# Patient Record
Sex: Male | Born: 1970 | Race: White | Hispanic: No | Marital: Married | Smoking: Current every day smoker
Health system: Southern US, Community
[De-identification: ages and names within clinical notes are randomized; demographics above are authoritative.]

## PROBLEM LIST (undated history)

## (undated) HISTORY — PX: GASTRIC BYPASS: SHX52

## (undated) HISTORY — PX: TONSILLECTOMY: SUR1361

---

## 2010-05-21 ENCOUNTER — Inpatient Hospital Stay: Payer: Self-pay | Admitting: Specialist

## 2010-05-26 ENCOUNTER — Emergency Department: Payer: Self-pay | Admitting: Emergency Medicine

## 2013-02-12 ENCOUNTER — Ambulatory Visit: Payer: Self-pay

## 2018-05-14 ENCOUNTER — Ambulatory Visit: Admission: RE | Admit: 2018-05-14 | Payer: 59 | Source: Ambulatory Visit | Admitting: Gastroenterology

## 2018-05-14 ENCOUNTER — Encounter: Admission: RE | Payer: Self-pay | Source: Ambulatory Visit

## 2018-05-14 SURGERY — EGD (ESOPHAGOGASTRODUODENOSCOPY)
Anesthesia: General

## 2019-03-11 ENCOUNTER — Other Ambulatory Visit: Payer: Self-pay | Admitting: Internal Medicine

## 2019-03-11 DIAGNOSIS — R918 Other nonspecific abnormal finding of lung field: Secondary | ICD-10-CM

## 2019-03-21 ENCOUNTER — Other Ambulatory Visit: Payer: Self-pay

## 2019-03-21 ENCOUNTER — Ambulatory Visit
Admission: RE | Admit: 2019-03-21 | Discharge: 2019-03-21 | Disposition: A | Discharge status: Home / Self Care | Payer: 59 | Source: Ambulatory Visit | Attending: Internal Medicine | Admitting: Internal Medicine

## 2019-03-21 DIAGNOSIS — R918 Other nonspecific abnormal finding of lung field: Secondary | ICD-10-CM | POA: Insufficient documentation

## 2020-01-30 ENCOUNTER — Emergency Department
Admission: EM | Admit: 2020-01-30 | Discharge: 2020-01-30 | Disposition: A | Discharge status: Home / Self Care | Payer: 59 | Attending: Emergency Medicine | Admitting: Emergency Medicine

## 2020-01-30 ENCOUNTER — Other Ambulatory Visit: Payer: Self-pay

## 2020-01-30 DIAGNOSIS — M7989 Other specified soft tissue disorders: Secondary | ICD-10-CM | POA: Diagnosis not present

## 2020-01-30 DIAGNOSIS — Z5321 Procedure and treatment not carried out due to patient leaving prior to being seen by health care provider: Secondary | ICD-10-CM | POA: Diagnosis not present

## 2020-01-30 DIAGNOSIS — M25471 Effusion, right ankle: Secondary | ICD-10-CM | POA: Diagnosis not present

## 2020-01-30 DIAGNOSIS — R110 Nausea: Secondary | ICD-10-CM | POA: Diagnosis not present

## 2020-01-30 DIAGNOSIS — R21 Rash and other nonspecific skin eruption: Secondary | ICD-10-CM | POA: Insufficient documentation

## 2020-01-30 DIAGNOSIS — R197 Diarrhea, unspecified: Secondary | ICD-10-CM | POA: Diagnosis not present

## 2020-01-30 DIAGNOSIS — A770 Spotted fever due to Rickettsia rickettsii: Secondary | ICD-10-CM | POA: Diagnosis not present

## 2020-01-30 LAB — COMPREHENSIVE METABOLIC PANEL
ALT: 58 U/L — ABNORMAL HIGH (ref 0–44)
AST: 57 U/L — ABNORMAL HIGH (ref 15–41)
Albumin: 3.5 g/dL (ref 3.5–5.0)
Alkaline Phosphatase: 71 U/L (ref 38–126)
Anion gap: 13 (ref 5–15)
BUN: 12 mg/dL (ref 6–20)
CO2: 21 mmol/L — ABNORMAL LOW (ref 22–32)
Calcium: 8.4 mg/dL — ABNORMAL LOW (ref 8.9–10.3)
Chloride: 99 mmol/L (ref 98–111)
Creatinine, Ser: 0.78 mg/dL (ref 0.61–1.24)
GFR calc Af Amer: >60 mL/min (ref >60)
GFR calc non Af Amer: >60 mL/min (ref >60)
Glucose, Bld: 120 mg/dL — ABNORMAL HIGH (ref 70–99)
Potassium: 3 mmol/L — ABNORMAL LOW (ref 3.5–5.1)
Sodium: 133 mmol/L — ABNORMAL LOW (ref 135–145)
Total Bilirubin: 0.9 mg/dL (ref 0.3–1.2)
Total Protein: 7.3 g/dL (ref 6.5–8.1)

## 2020-01-30 LAB — CBC WITH DIFFERENTIAL/PLATELET
Abs Immature Granulocytes: 0.6 10*3/uL — ABNORMAL HIGH (ref 0.00–0.07)
Basophils Absolute: 0 10*3/uL (ref 0.0–0.1)
Basophils Relative: 0 %
Eosinophils Absolute: 0.1 10*3/uL (ref 0.0–0.5)
Eosinophils Relative: 1 %
HCT: 38.5 % — ABNORMAL LOW (ref 39.0–52.0)
Hemoglobin: 13.5 g/dL (ref 13.0–17.0)
Immature Granulocytes: 4 %
Lymphocytes Relative: 15 %
Lymphs Abs: 2.1 10*3/uL (ref 0.7–4.0)
MCH: 32.1 pg (ref 26.0–34.0)
MCHC: 35.1 g/dL (ref 30.0–36.0)
MCV: 91.7 fL (ref 80.0–100.0)
Monocytes Absolute: 1.5 10*3/uL — ABNORMAL HIGH (ref 0.1–1.0)
Monocytes Relative: 11 %
Neutro Abs: 9.5 10*3/uL — ABNORMAL HIGH (ref 1.7–7.7)
Neutrophils Relative %: 69 %
Platelets: 433 10*3/uL — ABNORMAL HIGH (ref 150–400)
RBC: 4.2 MIL/uL — ABNORMAL LOW (ref 4.22–5.81)
RDW: 12.6 % (ref 11.5–15.5)
WBC: 13.8 10*3/uL — ABNORMAL HIGH (ref 4.0–10.5)
nRBC: 0 % (ref 0.0–0.2)

## 2020-01-30 NOTE — ED Triage Notes (Signed)
Pt reports recent visit to ER for possible [place] fever. Has been taking antibiotic.  Pt reports swelling to joints (right elbow, left knee, and right ankle) with difficulty swelling. No redness noted.  Reports has been over 24 hours without fever.  +diarrhea, intermittent nausea Light pink spotted rash noted to right ankle, pt reports extend up legs  Pt in NAD, RR even and unlabored   This RN discussed pt with Dr Larinda Buttery

## 2020-03-30 ENCOUNTER — Other Ambulatory Visit (HOSPITAL_COMMUNITY): Payer: Self-pay | Admitting: Internal Medicine

## 2020-03-30 ENCOUNTER — Other Ambulatory Visit: Payer: Self-pay | Admitting: Internal Medicine

## 2020-03-30 DIAGNOSIS — R918 Other nonspecific abnormal finding of lung field: Secondary | ICD-10-CM

## 2020-04-09 ENCOUNTER — Other Ambulatory Visit: Payer: Self-pay | Admitting: Student

## 2020-04-09 DIAGNOSIS — M47816 Spondylosis without myelopathy or radiculopathy, lumbar region: Secondary | ICD-10-CM

## 2020-04-09 DIAGNOSIS — M545 Low back pain, unspecified: Secondary | ICD-10-CM

## 2020-04-09 DIAGNOSIS — M461 Sacroiliitis, not elsewhere classified: Secondary | ICD-10-CM

## 2020-04-14 ENCOUNTER — Ambulatory Visit
Admission: RE | Admit: 2020-04-14 | Discharge: 2020-04-14 | Disposition: A | Discharge status: Home / Self Care | Payer: 59 | Source: Ambulatory Visit | Attending: Internal Medicine | Admitting: Internal Medicine

## 2020-04-14 ENCOUNTER — Other Ambulatory Visit: Payer: Self-pay

## 2020-04-14 DIAGNOSIS — R918 Other nonspecific abnormal finding of lung field: Secondary | ICD-10-CM | POA: Insufficient documentation

## 2020-04-29 ENCOUNTER — Ambulatory Visit: Admission: RE | Admit: 2020-04-29 | Payer: 59 | Source: Ambulatory Visit

## 2020-05-10 ENCOUNTER — Other Ambulatory Visit: Payer: Self-pay

## 2020-05-10 ENCOUNTER — Ambulatory Visit
Admission: RE | Admit: 2020-05-10 | Discharge: 2020-05-10 | Disposition: A | Discharge status: Home / Self Care | Payer: 59 | Source: Ambulatory Visit | Attending: Student | Admitting: Student

## 2020-05-10 DIAGNOSIS — M47816 Spondylosis without myelopathy or radiculopathy, lumbar region: Secondary | ICD-10-CM | POA: Diagnosis present

## 2020-05-10 DIAGNOSIS — M461 Sacroiliitis, not elsewhere classified: Secondary | ICD-10-CM | POA: Diagnosis present

## 2020-05-10 DIAGNOSIS — M545 Low back pain, unspecified: Secondary | ICD-10-CM | POA: Diagnosis present

## 2020-05-20 ENCOUNTER — Emergency Department
Admission: EM | Admit: 2020-05-20 | Discharge: 2020-05-21 | Disposition: A | Discharge status: Home / Self Care | Payer: 59 | Attending: Emergency Medicine | Admitting: Emergency Medicine

## 2020-05-20 ENCOUNTER — Other Ambulatory Visit: Payer: Self-pay

## 2020-05-20 ENCOUNTER — Emergency Department: Payer: 59

## 2020-05-20 DIAGNOSIS — T148XXA Other injury of unspecified body region, initial encounter: Secondary | ICD-10-CM

## 2020-05-20 DIAGNOSIS — M79662 Pain in left lower leg: Secondary | ICD-10-CM | POA: Insufficient documentation

## 2020-05-20 DIAGNOSIS — M7981 Nontraumatic hematoma of soft tissue: Secondary | ICD-10-CM | POA: Insufficient documentation

## 2020-05-20 DIAGNOSIS — M7989 Other specified soft tissue disorders: Secondary | ICD-10-CM | POA: Insufficient documentation

## 2020-05-20 DIAGNOSIS — F17210 Nicotine dependence, cigarettes, uncomplicated: Secondary | ICD-10-CM | POA: Insufficient documentation

## 2020-05-20 LAB — BASIC METABOLIC PANEL
Anion gap: 11 (ref 5–15)
BUN: 10 mg/dL (ref 6–20)
CO2: 21 mmol/L — ABNORMAL LOW (ref 22–32)
Calcium: 9.1 mg/dL (ref 8.9–10.3)
Chloride: 106 mmol/L (ref 98–111)
Creatinine, Ser: 0.59 mg/dL — ABNORMAL LOW (ref 0.61–1.24)
GFR, Estimated: >60 mL/min (ref >60)
Glucose, Bld: 102 mg/dL — ABNORMAL HIGH (ref 70–99)
Potassium: 3.5 mmol/L (ref 3.5–5.1)
Sodium: 138 mmol/L (ref 135–145)

## 2020-05-20 LAB — CBC
HCT: 40.8 % (ref 39.0–52.0)
Hemoglobin: 14.2 g/dL (ref 13.0–17.0)
MCH: 32.4 pg (ref 26.0–34.0)
MCHC: 34.8 g/dL (ref 30.0–36.0)
MCV: 93.2 fL (ref 80.0–100.0)
Platelets: 287 10*3/uL (ref 150–400)
RBC: 4.38 MIL/uL (ref 4.22–5.81)
RDW: 13.2 % (ref 11.5–15.5)
WBC: 16 10*3/uL — ABNORMAL HIGH (ref 4.0–10.5)
nRBC: 0 % (ref 0.0–0.2)

## 2020-05-20 MED ORDER — OXYCODONE-ACETAMINOPHEN 5-325 MG PO TABS
1.0000 | ORAL_TABLET | Freq: Once | ORAL | Status: AC
  Administered 2020-05-20: 1 via ORAL
  Filled 2020-05-20: qty 1

## 2020-05-20 MED ORDER — OXYCODONE-ACETAMINOPHEN 5-325 MG PO TABS: 1.0000 | ORAL_TABLET | ORAL | 0 refills | Status: AC | PRN

## 2020-05-20 NOTE — ED Triage Notes (Signed)
Pt arrived via POV with reports of L leg calf swelling since having MRI about 10 days ago. Pt reports calf is painful, red and warm to the touch.    Swelling noted from foot to L knee.  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

## 2020-05-20 NOTE — ED Notes (Signed)
Pt updated on wait time, pt has left leg propped up on chair, no distress noted at this time.

## 2020-05-20 NOTE — ED Notes (Signed)
Patient resting in stretcher. Pt c/o 5/10 pain when resting, "pain only goes to 10/10 when the leg is touched".  NAD noted, VSS. Pt has visitor bedside. Denies further needs at this time.

## 2020-05-20 NOTE — ED Provider Notes (Signed)
[facility] Emergency Department Provider Note   ____________________________________________   Event Date/Time   First MD Initiated Contact with Patient 05/20/20 2312     (approximate)  I have reviewed the triage vital signs and the nursing notes.   HISTORY  Chief Complaint Leg Swelling    HPI James Calderon is a 49 y.o. male who presents to the ED from home with a chief complaint of left calf swelling since having lumbar spine MRI approximately 10 days ago.  Does not remember fall/injury/trauma.  Reports calf is painful, tender, red and warm to the touch.  Denies associated chest pain or shortness of breath.  Denies recent fever, cough, abdominal pain, nausea, vomiting or dizziness.  Denies recent travel or hormone use.     Past medical history Sacroiliitis  There are no problems to display for this patient.   Past Surgical History:  Procedure Laterality Date  . GASTRIC BYPASS    . TONSILLECTOMY      Prior to Admission medications   Medication Sig Start Date End Date Taking? Authorizing Provider  oxyCODONE-acetaminophen (PERCOCET/ROXICET) 5-325 MG tablet Take 1 tablet by mouth every 4 (four) hours as needed for severe pain. 05/20/20   Irean Hong, MD    Allergies Cephalosporins, Nsaids, Pravastatin, Pravastatin sodium, and Sulfa antibiotics  No family history on file.  Social History Social History   Tobacco Use  . Smoking status: Current Every Day Smoker    Packs/day: 0.50    Types: Cigarettes  . Smokeless tobacco: Never Used  Substance Use Topics  . Alcohol use: Yes  . Drug use: Not Currently    Review of Systems  Constitutional: No fever/chills Eyes: No visual changes. ENT: No sore throat. Cardiovascular: Denies chest pain. Respiratory: Denies shortness of breath. Gastrointestinal: No abdominal pain.  No nausea, no vomiting.  No diarrhea.  No constipation. Genitourinary: Negative for dysuria. Musculoskeletal:  Positive for left leg pain and swelling.  Negative for back pain. Skin: Negative for rash. Neurological: Negative for headaches, focal weakness or numbness.   ____________________________________________   PHYSICAL EXAM:  VITAL SIGNS: ED Triage Vitals  Enc Vitals Group     BP 05/20/20 1648 140/71     Pulse Rate 05/20/20 1648 96     Resp 05/20/20 1648 18     Temp 05/20/20 1648 99 F (37.2 C)     Temp Source 05/20/20 1648 Oral     SpO2 05/20/20 1648 99 %     Weight 05/20/20 1652 245 lb (111.1 kg)     Height 05/20/20 1652 5\' 10"  (1.778 m)     Head Circumference --      Peak Flow --      Pain Score 05/20/20 1652 4     Pain Loc --      Pain Edu? --      Excl. in GC? --     Constitutional: Alert and oriented. Well appearing and in no acute distress. Eyes: Conjunctivae are normal. PERRL. EOMI. Head: Atraumatic. Nose: No congestion/rhinnorhea. Mouth/Throat: Mucous membranes are moist.  Oropharynx non-erythematous. Neck: No stridor.   Cardiovascular: Normal rate, regular rhythm. Grossly normal heart sounds.  Good peripheral circulation. Respiratory: Normal respiratory effort.  No retractions. Lungs CTAB. Gastrointestinal: Soft and nontender. No distention. No abdominal bruits. No CVA tenderness. Musculoskeletal:  LLE: calf swelling which is tender to palpation.  Warmth and erythema without fluctuance.  2+ distal pulses.  Brisk, less than 5-second capillary refill. Neurologic:  Normal speech and language. No  gross focal neurologic deficits are appreciated. No gait instability. Skin:  Skin is warm, dry and intact. No rash noted. Psychiatric: Mood and affect are normal. Speech and behavior are normal.  ____________________________________________   LABS (all labs ordered are listed, but only abnormal results are displayed)  Labs Reviewed  BASIC METABOLIC PANEL - Abnormal; Notable for the following components:      Result Value   CO2 21 (*)    Glucose, Bld 102 (*)     Creatinine, Ser 0.59 (*)    All other components within normal limits  CBC - Abnormal; Notable for the following components:   WBC 16.0 (*)    All other components within normal limits   ____________________________________________  EKG  None ____________________________________________  RADIOLOGY I, Shaasia Odle J, personally viewed and evaluated these images (plain radiographs) as part of my medical decision making, as well as reviewing the written report by the radiologist.  ED MD interpretation: No DVT, hematoma versus sequela of muscle strain  Official radiology report(s): US Venous Img Lower Unilateral Left  Result Date: 05/20/2020 CLINICAL DATA:  Leg swelling. EXAM: LEFT LOWER EXTREMITY VENOUS DOPPLER ULTRASOUND TECHNIQUE: Gray-scale sonography with compression, as well as color and duplex ultrasound, were performed to evaluate the deep venous system(s) from the level of the common femoral vein through the popliteal and proximal calf veins. COMPARISON:  None. FINDINGS: VENOUS Normal compressibility of the common femoral, superficial femoral, and popliteal veins, as well as the visualized calf veins. Visualized portions of profunda femoral vein and great saphenous vein unremarkable. No filling defects to suggest DVT on grayscale or color Doppler imaging. Doppler waveforms show normal direction of venous flow, normal respiratory plasticity and response to augmentation. Limited views of the contralateral common femoral vein are unremarkable. OTHER In the area of concern in the left calf, there is a complex collection measuring approximately 11.8 x 2.1 x 7.7 cm. This appears to be deep to the subcutaneous fat and superficial to the underlying musculature. This collection does not appear to demonstrate significant internal color Doppler flow. Limitations: none IMPRESSION: 1. No DVT. 2. Complex collection measuring 11.8 cm at the level of the left calf. Differential considerations include a  hematoma or sequela of a muscle strain/sprain. Electronically Signed   By: Katherine Mantle M.D.   On: 05/20/2020 17:59    ____________________________________________   PROCEDURES  Procedure(s) performed (including Critical Care):  Procedures   ____________________________________________   INITIAL IMPRESSION / ASSESSMENT AND PLAN / ED COURSE  As part of my medical decision making, I reviewed the following data within the electronic MEDICAL RECORD NUMBER History obtained from family, Nursing notes reviewed and incorporated, Labs reviewed, Old chart reviewed (05/12/2020 orthopedic visit), Notes from prior ED visits and [place] Controlled Substance Database     49 year old male presenting with left lower leg pain and swelling.  Differential diagnosis includes but is not limited to cellulitis, DVT, musculoskeletal injury, etc.  Ultrasound demonstrates hematoma.  Will administer analgesia, Ace wrap and patient will follow up with orthopedics as needed.  Strict return precautions given.  Patient and family member verbalized understanding and agree with plan of care.      ____________________________________________   FINAL CLINICAL IMPRESSION(S) / ED DIAGNOSES  Final diagnoses:  Left leg swelling  Hematoma     ED Discharge Orders         Ordered    oxyCODONE-acetaminophen (PERCOCET/ROXICET) 5-325 MG tablet  Every 4 hours PRN        05/20/20 2333          *  Please note:  James Calderon was evaluated in Emergency Department on 05/20/2020 for the symptoms described in the history of present illness. He was evaluated in the context of the global COVID-19 pandemic, which necessitated consideration that the patient might be at risk for infection with the SARS-CoV-2 virus that causes COVID-19. Institutional protocols and algorithms that pertain to the evaluation of patients at risk for COVID-19 are in a state of rapid change based on information released by regulatory bodies  including the CDC and federal and state organizations. These policies and algorithms were followed during the patient's care in the ED.  Some ED evaluations and interventions may be delayed as a result of limited staffing during and the pandemic.*   Note:  This document was prepared using Dragon voice recognition software and may include unintentional dictation errors.   Irean Hong, MD 05/21/20 [phone]

## 2020-05-20 NOTE — Discharge Instructions (Signed)
You may take Percocet as needed for pain.  Wear Ace wrap while awake.  Elevate leg whenever possible.  Return to the ER for worsening symptoms, persistent vomiting, difficulty breathing or other concerns.

## 2020-05-21 NOTE — ED Notes (Signed)
Ace wrap applied to L leg per EDP orders. Pt education completed by this RN. Patient and support person state understanding and care for ace wrap and leg. Discharge PW reviewed, patient states understanding. Patient gave this RN verbal consent for DC.  Patient transported to front entrance via wheelchair. Patient able to stand pivot without complaint.

## 2022-07-19 IMAGING — CT CT CHEST W/O CM
2 of 3 series · 15 of 36 positions shown, 18 images · non-contrast
Comparison: 03/21/2019

CLINICAL DATA: Pulmonary nodules.

EXAM:
CT CHEST WITHOUT CONTRAST
TECHNIQUE: Multidetector CT imaging of the chest was performed following the
standard protocol without IV contrast.

[Series 2: thorax · axial · 0.73mm/px · z∈[-371,-111]mm · 12 of 154 slices shown, 15 images]
[im 12/154  mediastinal]
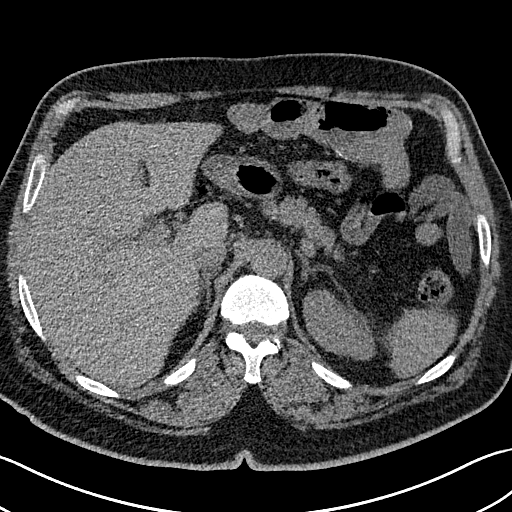
[im 12/154  lung]
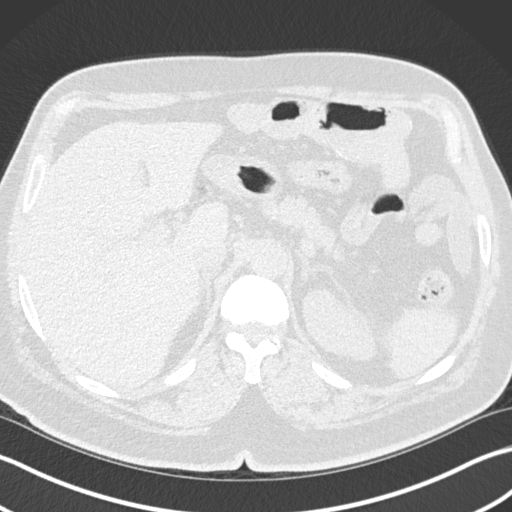
[im 23/154  lung]
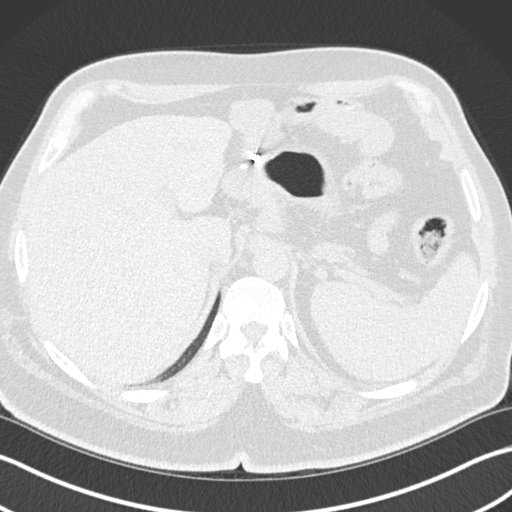
[im 35/154  lung]
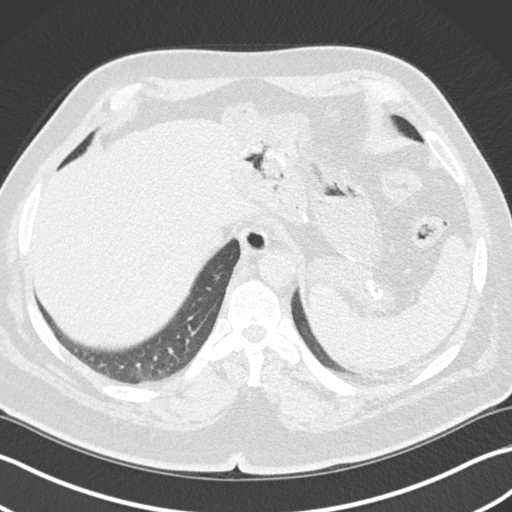
[im 46/154  lung]
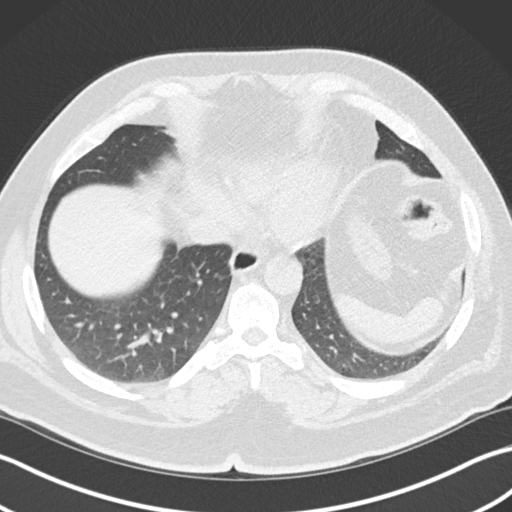
[im 57/154  mediastinal]
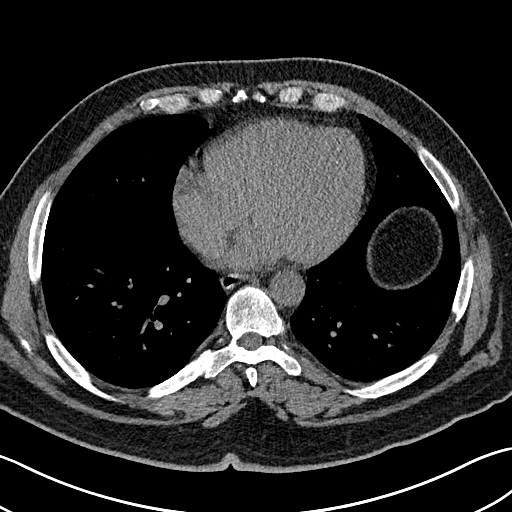
[im 57/154  lung]
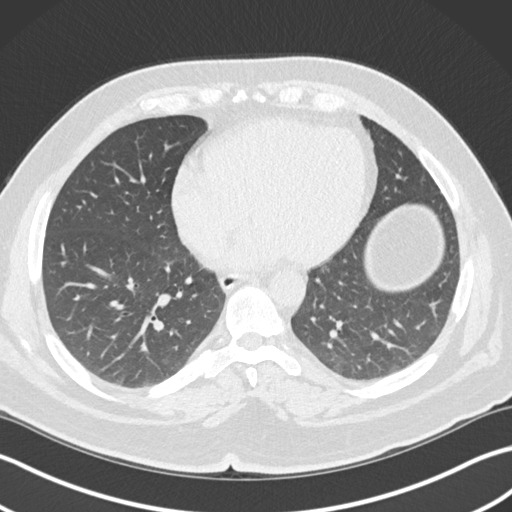
[im 69/154  lung]
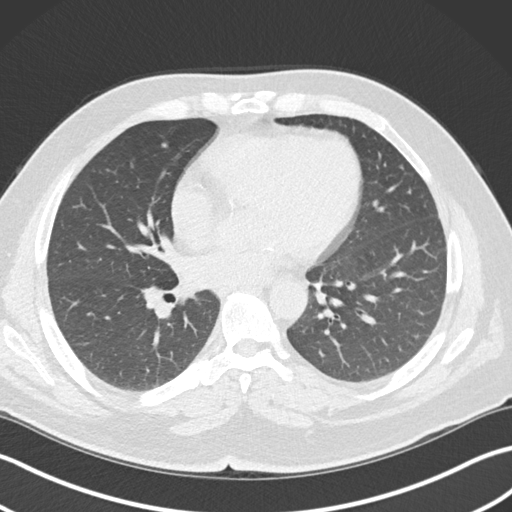
[im 86/154  lung]
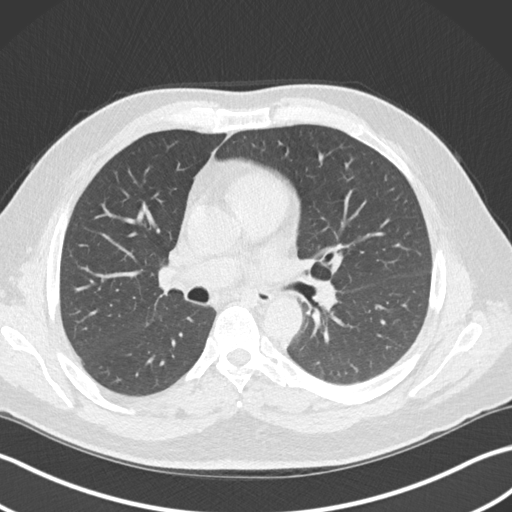
[im 97/154  lung]
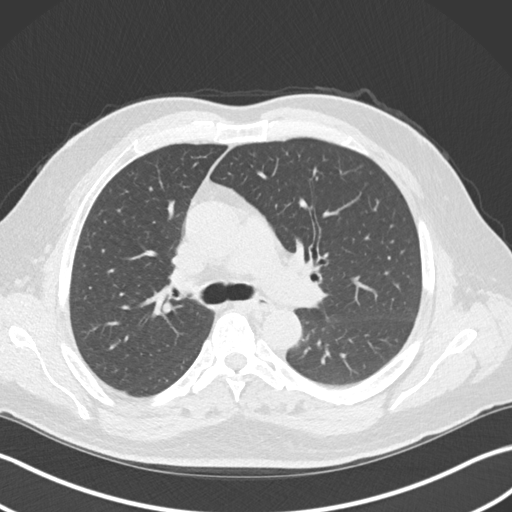
[im 108/154  mediastinal]
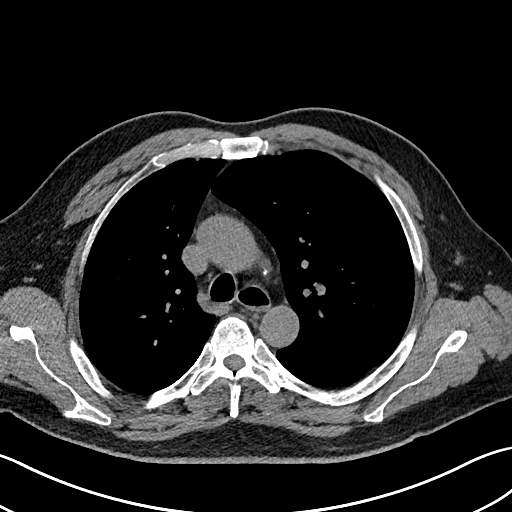
[im 108/154  lung]
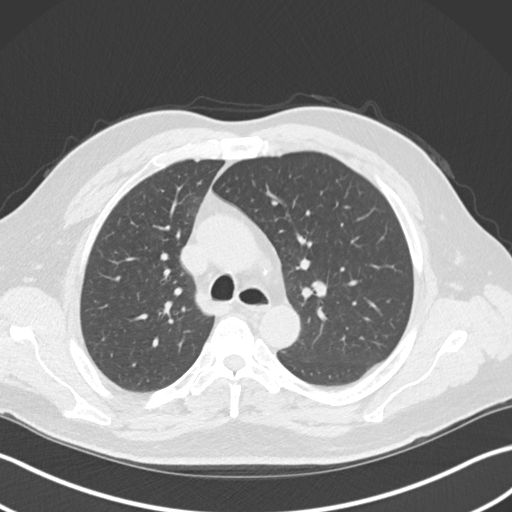
[im 120/154  lung]
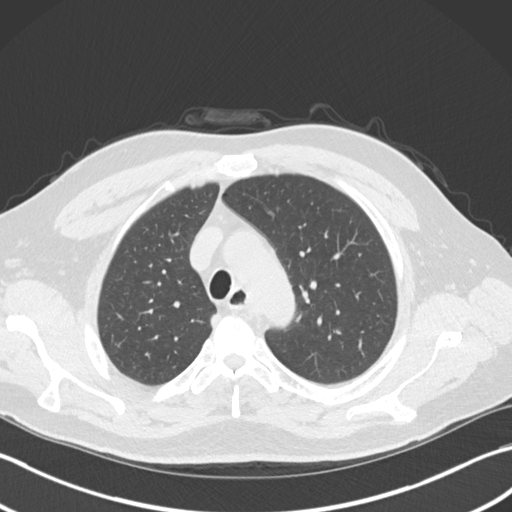
[im 131/154  lung]
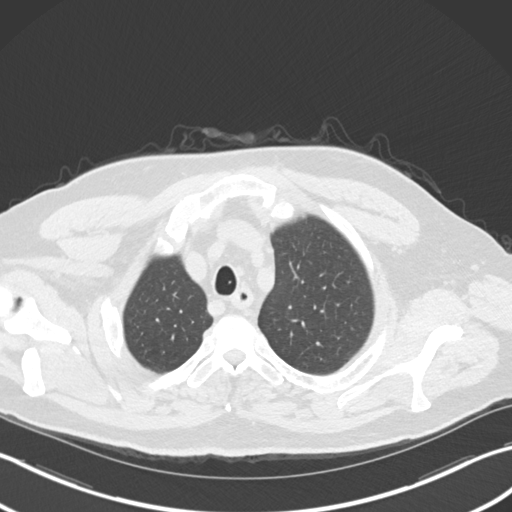
[im 142/154  lung]
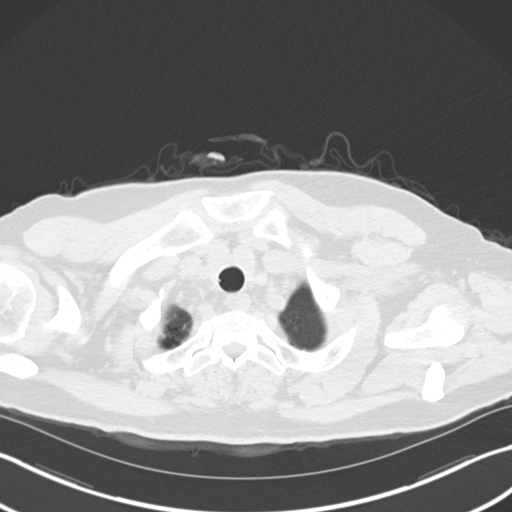

[Series 5: coronal · coronal · 0.66mm/px · 3 of 158 slices shown]
[im 32/158  lung]
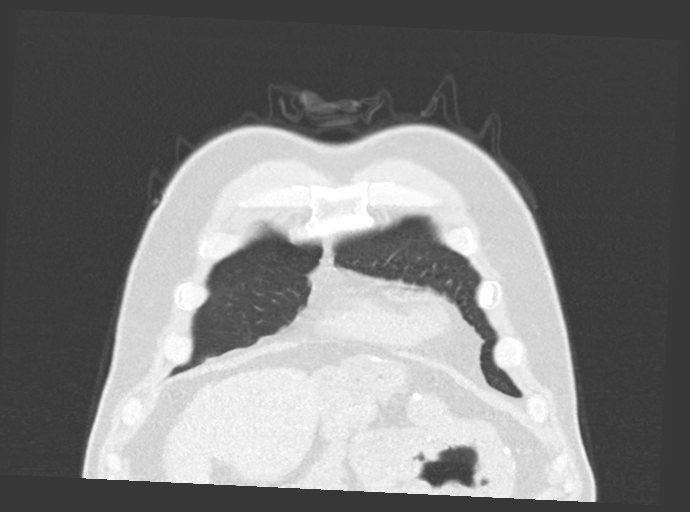
[im 63/158  lung]
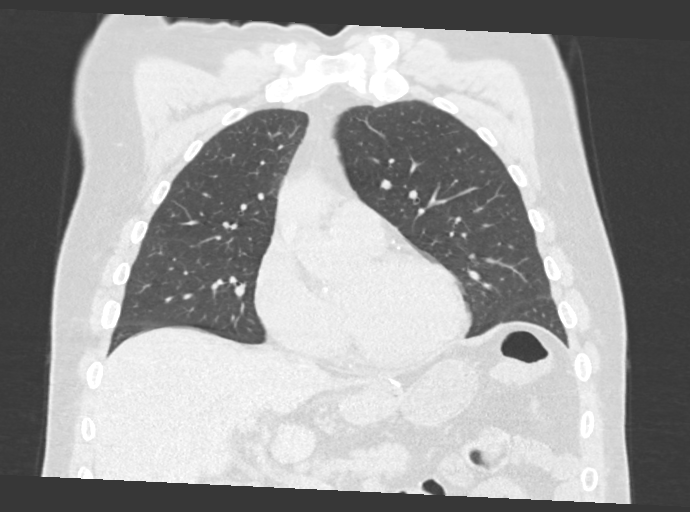
[im 95/158  lung]
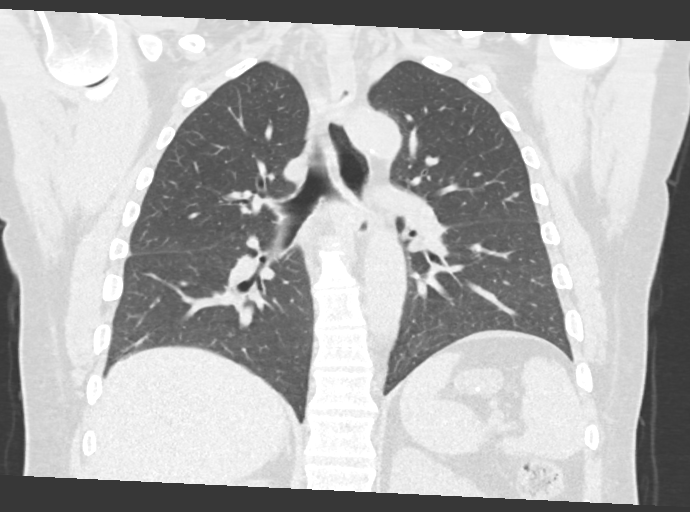

[15 of 36 positions shown; findings below may reference images not displayed]

FINDINGS: Cardiovascular: The heart size is normal. No substantial pericardial
effusion. Coronary artery calcification is evident. Atherosclerotic
calcification is noted in the wall of the thoracic aorta. Aberrant
origin right subclavian artery noted.

Mediastinum/Nodes: No mediastinal lymphadenopathy. No evidence for
gross hilar lymphadenopathy although assessment is limited by the
lack of intravenous contrast on today's study. The esophagus has
normal imaging features. There is no axillary lymphadenopathy.

Lungs/Pleura: Centrilobular emphsyema noted. 5 mm right middle lobe
nodule on 92/3 is stable 5 mm left upper lobe nodule on 3 is stable.
5 mm peripheral left lower lobe nodule on 92/3 is unchanged. Stable
3 mm left upper lobe nodule on 57/3. Several additional 3-4 mm
pulmonary nodules are unchanged anteromedial right upper lobe nodule
Eang, likely vascular confluence on 85/3 is stable. No new
suspicious nodule or mass. No focal airspace consolidation. Insert
no effusions

Upper Abdomen: Calcified gallstones, incompletely visualized.
Patient is status post gastric bypass surgery.

Musculoskeletal: No worrisome lytic or sclerotic osseous
abnormality.
IMPRESSION: 1. Stable bilateral pulmonary nodules measuring 5 mm less in size in
the 1 year interval since prior study. Per consensus guidelines,
these are, consistent with benign etiology.
2. Cholelithiasis.
3. Aberrant origin right subclavian artery.
4. Aortic Atherosclerosis ([id number]) and Emphysema ([id number]).
